# Patient Record
Sex: Male | Born: 2000 | Race: Black or African American | Hispanic: No | Marital: Single
Health system: Southern US, Community
[De-identification: ages and names within clinical notes are randomized; demographics above are authoritative.]

## PROBLEM LIST (undated history)

## (undated) DIAGNOSIS — I469 Cardiac arrest, cause unspecified: Secondary | ICD-10-CM

## (undated) DIAGNOSIS — R011 Cardiac murmur, unspecified: Secondary | ICD-10-CM

## (undated) DIAGNOSIS — J45909 Unspecified asthma, uncomplicated: Secondary | ICD-10-CM

## (undated) HISTORY — PX: IMPLANTABLE CARDIOVERTER DEFIBRILLATOR IMPLANT: SHX5860

---
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
## 2021-02-28 ENCOUNTER — Other Ambulatory Visit (HOSPITAL_COMMUNITY): Payer: Self-pay | Admitting: Pediatrics

## 2021-02-28 DIAGNOSIS — I4901 Ventricular fibrillation: Secondary | ICD-10-CM

## 2021-03-01 ENCOUNTER — Ambulatory Visit (HOSPITAL_COMMUNITY)
Admission: RE | Admit: 2021-03-01 | Discharge: 2021-03-01 | Disposition: A | Discharge status: Home / Self Care | Payer: Medicaid Other | Source: Ambulatory Visit | Attending: Pediatrics | Admitting: Pediatrics

## 2021-03-01 ENCOUNTER — Other Ambulatory Visit: Payer: Self-pay

## 2021-03-01 DIAGNOSIS — I4901 Ventricular fibrillation: Secondary | ICD-10-CM | POA: Insufficient documentation

## 2021-04-06 ENCOUNTER — Other Ambulatory Visit: Payer: Self-pay

## 2021-04-06 ENCOUNTER — Encounter (HOSPITAL_COMMUNITY): Payer: Self-pay | Admitting: *Deleted

## 2021-04-06 ENCOUNTER — Emergency Department (HOSPITAL_COMMUNITY)
Admission: EM | Admit: 2021-04-06 | Discharge: 2021-04-06 | Disposition: A | Discharge status: Home / Self Care | Payer: Medicaid Other | Attending: Emergency Medicine | Admitting: Emergency Medicine

## 2021-04-06 DIAGNOSIS — Z950 Presence of cardiac pacemaker: Secondary | ICD-10-CM | POA: Insufficient documentation

## 2021-04-06 DIAGNOSIS — Z8679 Personal history of other diseases of the circulatory system: Secondary | ICD-10-CM | POA: Insufficient documentation

## 2021-04-06 DIAGNOSIS — Z7722 Contact with and (suspected) exposure to environmental tobacco smoke (acute) (chronic): Secondary | ICD-10-CM | POA: Insufficient documentation

## 2021-04-06 DIAGNOSIS — Z79899 Other long term (current) drug therapy: Secondary | ICD-10-CM | POA: Insufficient documentation

## 2021-04-06 DIAGNOSIS — R630 Anorexia: Secondary | ICD-10-CM | POA: Diagnosis not present

## 2021-04-06 DIAGNOSIS — R55 Syncope and collapse: Secondary | ICD-10-CM | POA: Insufficient documentation

## 2021-04-06 DIAGNOSIS — J45909 Unspecified asthma, uncomplicated: Secondary | ICD-10-CM | POA: Diagnosis not present

## 2021-04-06 HISTORY — DX: Cardiac arrest, cause unspecified: I46.9

## 2021-04-06 LAB — BASIC METABOLIC PANEL
Anion gap: 9 (ref 5–15)
BUN: 10 mg/dL (ref 6–20)
CO2: 27 mmol/L (ref 22–32)
Calcium: 9.6 mg/dL (ref 8.9–10.3)
Chloride: 102 mmol/L (ref 98–111)
Creatinine, Ser: 0.93 mg/dL (ref 0.61–1.24)
GFR, Estimated: >60 mL/min (ref >60)
Glucose, Bld: 125 mg/dL — ABNORMAL HIGH (ref 70–99)
Potassium: 4.6 mmol/L (ref 3.5–5.1)
Sodium: 138 mmol/L (ref 135–145)

## 2021-04-06 LAB — CBC WITH DIFFERENTIAL/PLATELET
Abs Immature Granulocytes: 0.05 10*3/uL (ref 0.00–0.07)
Basophils Absolute: 0.1 10*3/uL (ref 0.0–0.1)
Basophils Relative: 1 %
Eosinophils Absolute: 0.2 10*3/uL (ref 0.0–0.5)
Eosinophils Relative: 3 %
HCT: 46 % (ref 39.0–52.0)
Hemoglobin: 14.5 g/dL (ref 13.0–17.0)
Immature Granulocytes: 1 %
Lymphocytes Relative: 23 %
Lymphs Abs: 1.8 10*3/uL (ref 0.7–4.0)
MCH: 25.7 pg — ABNORMAL LOW (ref 26.0–34.0)
MCHC: 31.5 g/dL (ref 30.0–36.0)
MCV: 81.4 fL (ref 80.0–100.0)
Monocytes Absolute: 0.7 10*3/uL (ref 0.1–1.0)
Monocytes Relative: 9 %
Neutro Abs: 5.2 10*3/uL (ref 1.7–7.7)
Neutrophils Relative %: 63 %
Platelets: 202 10*3/uL (ref 150–400)
RBC: 5.65 MIL/uL (ref 4.22–5.81)
RDW: 13.1 % (ref 11.5–15.5)
WBC: 8 10*3/uL (ref 4.0–10.5)
nRBC: 0 % (ref 0.0–0.2)

## 2021-04-06 LAB — TROPONIN I (HIGH SENSITIVITY)
Troponin I (High Sensitivity): 4 ng/L (ref <18)
Troponin I (High Sensitivity): 5 ng/L (ref <18)

## 2021-04-06 LAB — MAGNESIUM: Magnesium: 2.1 mg/dL (ref 1.7–2.4)

## 2021-04-06 MED ORDER — SODIUM CHLORIDE 0.9 % IV BOLUS
1000.0000 mL | Freq: Once | INTRAVENOUS | Status: AC
  Administered 2021-04-06: 1000 mL via INTRAVENOUS

## 2021-04-06 NOTE — ED Provider Notes (Signed)
[facility] EMERGENCY DEPARTMENT Provider Note   CSN: [id number] Arrival date & time: 04/06/21  4401     History Chief Complaint  Patient presents with  . Loss of Consciousness    Brian Hatfield is a 20 y.o. male.  Pt presents to the ED today with a syncopal event.  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  The pt is followed by Dr. Mindi Junker (peds cards at [facility]).  Pt has a Paediatric nurse pacemaker in place.  Pt had oral surgery on 5/17.  He had a near-syncopal event on 5/18.  He seemed to be fine and then had another syncopal event today around noon.  The pt has been eating and drinking well, but did not eat or drink prior to syncopal event. Pt was in a hot shower when he had this event.  His brother witnessed the episode.  It lasted about 2 minutes.  No shaking, but pt seemed to be staring at brother for a little bit.  He was normal after he "woke up."  He denies any prodromal feelings prior to event.  He feels fine now.        Past Medical History:  Diagnosis Date  . Asthma   . Cardiac arrest (HCC)   . Murmur     Patient Active Problem List   Diagnosis Date Noted  . Cardiac arrest (HCC) 08/23/2017  . Acute respiratory failure (HCC) 08/23/2017  . Inattention 08/26/2016    Past Surgical History:  Procedure Laterality Date  . IMPLANTABLE CARDIOVERTER DEFIBRILLATOR IMPLANT         History reviewed. No pertinent family history.  Social History   Tobacco Use  . Smoking status: Passive Smoke Exposure - Never Smoker  . Smokeless tobacco: Never Used  Substance Use Topics  . Alcohol use: No  . Drug use: No    Home Medications Prior to Admission medications   Medication Sig Start Date End Date Taking? Authorizing Provider  acetaminophen (TYLENOL) 500 MG tablet Take 1 tablet (500 mg total) by mouth every 6 (six) hours as needed for fever or headache. 09/26/14  Yes Tomasita Crumble, MD  albuterol (PROVENTIL HFA;VENTOLIN HFA) 108 (90 Base)  MCG/ACT inhaler Inhale 1-2 puffs into the lungs every 6 (six) hours as needed for wheezing or shortness of breath.   Yes [provider]  cloNIDine (CATAPRES) 0.1 MG tablet Take 0.1 mg by mouth every evening.   Yes [provider]  ibuprofen (ADVIL,MOTRIN) 400 MG tablet Take 1 tablet (400 mg total) by mouth every 6 (six) hours as needed for fever or headache. 09/26/14  Yes Tomasita Crumble, MD  nadolol (CORGARD) 40 MG tablet Take 40 mg by mouth daily.   Yes [provider]    Allergies    Other  Review of Systems   Review of Systems  Neurological: Positive for syncope.  All other systems reviewed and are negative.   Physical Exam Updated Vital Signs BP 137/60   Pulse (!) 103   Temp 99.3 F (37.4 C) (Oral)   Resp (!) 22   Ht 6\' 1"  (1.854 m)   Wt 59 kg   SpO2 97%   BMI 17.15 kg/m   Physical Exam Vitals and nursing note reviewed.  Constitutional:      Appearance: Normal appearance.  HENT:     Head: Normocephalic and atraumatic.     Right Ear: External ear normal.     Left Ear: External  ear normal.     Nose: Nose normal.     Mouth/Throat:     Mouth: Mucous membranes are moist.     Pharynx: Oropharynx is clear.  Eyes:     Extraocular Movements: Extraocular movements intact.     Conjunctiva/sclera: Conjunctivae normal.     Pupils: Pupils are equal, round, and reactive to light.  Cardiovascular:     Rate and Rhythm: Normal rate and regular rhythm.     Pulses: Normal pulses.     Heart sounds: Normal heart sounds.  Pulmonary:     Effort: Pulmonary effort is normal.     Breath sounds: Normal breath sounds.  Abdominal:     General: Abdomen is flat. Bowel sounds are normal.     Palpations: Abdomen is soft.  Musculoskeletal:        General: Normal range of motion.     Cervical back: Normal range of motion and neck supple.  Skin:    General: Skin is warm.     Capillary Refill: Capillary refill takes less than 2 seconds.  Neurological:     Mental  Status: He is alert and oriented to person, place, and time. Mental status is at baseline.  Psychiatric:        Mood and Affect: Mood normal.        Behavior: Behavior normal.     ED Results / Procedures / Treatments   Labs (all labs ordered are listed, but only abnormal results are displayed) Labs Reviewed  CBC WITH DIFFERENTIAL/PLATELET - Abnormal; Notable for the following components:      Result Value   MCH 25.7 (*)    All other components within normal limits  BASIC METABOLIC PANEL - Abnormal; Notable for the following components:   Glucose, Bld 125 (*)    All other components within normal limits  MAGNESIUM  TROPONIN I (HIGH SENSITIVITY)  TROPONIN I (HIGH SENSITIVITY)    EKG EKG Interpretation  Date/Time:  Thursday Apr 06 2021 16:09:26 EDT Ventricular Rate:  113 PR Interval:  182 QRS Duration: 92 QT Interval:  324 QTC Calculation: 444 R Axis:   270 Text Interpretation: Sinus tachycardia Right atrial enlargement Left axis deviation Pulmonary disease pattern Abnormal ECG No old tracing to compare Confirmed by Jacalyn Lefevre [phone]) on 04/06/2021 6:05:35 PM   Radiology No results found.  Procedures Procedures   Medications Ordered in ED Medications  sodium chloride 0.9 % bolus 1,000 mL (1,000 mLs Intravenous New Bag/Given 04/06/21 1842)    ED Course  I have reviewed the triage vital signs and the nursing notes.  Pertinent labs & imaging results that were available during my care of the patient were reviewed by me and considered in my medical decision making (see chart for details).    MDM Rules/Calculators/A&P                          Pt is on Nadolol 40 mg daily.  He missed his dose this am.  He has not missed any prior doses.  Pt's pacemaker interrogated.  Per report, he had a VT episode at 2:17 pm with 172 bpm.  The AICD is not triggered to go off until 214 bpm.  It lasted for 2 minutes and 36 seconds.  This does not coincide with his syncopal  event.  I spoke with the pediatric cardiologist on call at [facility].  He asked that I take pictures of the [facility] printout and put them in  the media section so the EP specialists at [facility] can review.  I did that and the EP people think it was ST.  Not VT.  No med changes recommended.  Pt is to make sure he takes his meds as directed.    Syncopal event today likely due to hot shower and not eating.  Labs nl.  Dr. Mindi Junker can follow up with pt on Wed, June 1 at the [facility] office.  Pt and mom feel comfortable with this plan.  He is to return if worse.  CRITICAL CARE Performed by: Jacalyn Lefevre   Total critical care time: 30 minutes  Critical care time was exclusive of separately billable procedures and treating other patients.  Critical care was necessary to treat or prevent imminent or life-threatening deterioration.  Critical care was time spent personally by me on the following activities: development of treatment plan with patient and/or surrogate as well as nursing, discussions with consultants, evaluation of patient's response to treatment, examination of patient, obtaining history from patient or surrogate, ordering and performing treatments and interventions, ordering and review of laboratory studies, ordering and review of radiographic studies, pulse oximetry and re-evaluation of patient's condition. Final Clinical Impression(s) / ED Diagnoses Final diagnoses:  Syncope, unspecified syncope type    Rx / DC Orders ED Discharge Orders    None       Jacalyn Lefevre, MD 04/06/21 2103

## 2021-04-06 NOTE — ED Notes (Signed)
Pt verbalizes understanding of d/c instructions. Pt ambulatory at d/c with all belongings and with family.   

## 2021-04-06 NOTE — ED Notes (Signed)
Pt's Medtronic pacemaker interrogated. 

## 2021-04-06 NOTE — ED Triage Notes (Signed)
Pt reports having syncopal episode today, similar episode last wed. Had wisdom teeth removed last Tuesday. Pt has no complaints, states he felt fine today. Hx of vfib arrest when 20yo.

## 2021-04-06 NOTE — ED Provider Notes (Signed)
Emergency Medicine Provider Triage Evaluation Note  Brian Hatfield , a 20 y.o. male  was evaluated in triage.  Pt complains of syncope. He had some preceding lightheadedness and vision changes prior to the episode. No preceding chest pain or sob. Defibrillator did not fire.   Review of Systems  Positive: syncope Negative: Chest pain, sob  Physical Exam  BP (!) 119/104   Pulse 100   Temp 99.3 F (37.4 C) (Oral)   Resp 16   SpO2 100%  Gen:   Awake, no distress   Resp:  Normal effort  MSK:   Moves extremities without difficulty  Other:  Clear speech, tachycardic  Medical Decision Making  Medically screening exam initiated at 4:32 PM.  Appropriate orders placed.  Khan A Sheppard was informed that the remainder of the evaluation will be completed by another provider, this initial triage assessment does not replace that evaluation, and the importance of remaining in the ED until their evaluation is complete.     Karrie Meres, PA-C 04/06/21 1638    Margarita Grizzle, MD 04/06/21 2308

## 2024-01-21 DIAGNOSIS — Z931 Gastrostomy status: Secondary | ICD-10-CM | POA: Insufficient documentation

## 2024-01-21 DIAGNOSIS — K30 Functional dyspepsia: Secondary | ICD-10-CM | POA: Insufficient documentation

## 2024-01-21 DIAGNOSIS — G931 Anoxic brain damage, not elsewhere classified: Secondary | ICD-10-CM | POA: Insufficient documentation

## 2024-01-21 DIAGNOSIS — I429 Cardiomyopathy, unspecified: Secondary | ICD-10-CM | POA: Insufficient documentation

## 2024-01-21 DIAGNOSIS — T827XXA Infection and inflammatory reaction due to other cardiac and vascular devices, implants and grafts, initial encounter: Secondary | ICD-10-CM | POA: Insufficient documentation

## 2024-01-21 DIAGNOSIS — R112 Nausea with vomiting, unspecified: Secondary | ICD-10-CM | POA: Insufficient documentation

## 2024-01-27 DIAGNOSIS — R7303 Prediabetes: Secondary | ICD-10-CM | POA: Insufficient documentation

## 2024-02-03 DIAGNOSIS — F331 Major depressive disorder, recurrent, moderate: Secondary | ICD-10-CM | POA: Insufficient documentation

## 2024-06-30 ENCOUNTER — Encounter: Payer: Self-pay | Admitting: Neurology

## 2024-06-30 ENCOUNTER — Ambulatory Visit: Payer: Self-pay | Admitting: Neurology

## 2024-06-30 NOTE — Progress Notes (Signed)
 Chief Complaint  Patient presents with   Establish Care   Gait Problem    Pt in room 14, with mom, hx of TBI, Would like to have an MRI    ASSESSMENT AND PLAN  Brian Hatfield is a 23 y.o. male   History of hypoxic encephalopathy  Initial MRI showed significant abnormality,  He has residual cognitive malfunction mild motor difficulties, family desire further evaluation,  MRI of the brain with without contrast  EEG  Neuropsychology evaluation  Return in 6 months  DIAGNOSTIC DATA (LABS, IMAGING, TESTING) - I reviewed patient records, labs, notes, testing and imaging myself where available.   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 I personally reviewed pertinent available imaging films in PACS.   PMHx of  Hx of  defibrillator implant   I reviewed the pediatric neurology evaluation at [facility] system on January 02, 2018,   Patient suffered cardiac arrest on August 23, 2017 at age 22, after playing basketball, he suddenly became unresponsive, pulseless upon EMS arrival, required prolonged CPR, AED showed ventricular fibrillation, defibrillated twice with subsequent sinus tachycardia, initially was treated at [facility] emergency room, pulseless ventricular tachycardia during transportation, defibrillated twice again, there was also ventricular bigeminy with epinephrine  infusion, initial gases showed pH 7.13, lactate 10, troponin 0.34, bicarb 16, chest x-ray showed possible cardiomegaly, bilateral apical pneumothoraces,  He was later transferred to [facility], started on esmolol  infusion for 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, QT interval 480- 490  initially, improved to 420-430  He had intubation, required hypothermia protocol, abnormal EEG, nonconvulsive status epilepticus response to Phospha phenytoin, Keppra, lorazepam  MRI of the brain without contrast at [facility] system August 28, 2017 showed diffuse supratentorial cortical diffuse restriction, compatible with hypoxic ischemic encephalopathy, multiple foci of diffuse restriction in the left cerebellar hemisphere concerning for punctuate foci of acute infarction, no hemorrhagic transformation  Prolonged video EEG monitoring on Aug 29 2017: Moderate to severe diffuse slowing, left more than right occipital sharps, improved over the course of the recording  He subsequently developed neurostorming 1 week postarrest, which required increased amount of propofol, bromocriptine, gabapentin, baclofen, clonazepam, Artane, tizanidine, sedating medications,  Initial course was also complicated by spasticity, dystonia, persistent choreiform movement, then he eventually began to improve, without any further seizure activity, began follow commands, speaking by October 04, 2017, was discharged to rehabilitation with polypharmacy, clonidine 100 mcg every 6 hours, gabapentin 600/300/600, bromocriptine 3.75 mg twice a day, clonazepam 0.25 mg every night, oxycodone 5 mg every 8 hours, baclofen 15 mg 3 times a day, Artane 2 mg twice a day  He later underwent prolonged neurorehabilitation, by the return visit in February 2019, he was able to ambulate without difficulty  Labs in March 2025, negative HIV, RPR,  creat 0.98 A1C 6, TSH, Vit D 23,  He now lives at home with his parents, spend most of the time playing video games, watching TV, no clinical seizure activity, would like to walk in the park at 5 minutes away from his home, still has mildly unsteady gait, significant short-term memory loss, difficulty reading, at third grade level, had neuropsychology evaluation prior to be granted social disability few  years back    PHYSICAL EXAM:  Vitals:   06/30/24 0823  BP: (!) 149/73  Pulse: 63  Weight: 159 lb 8 oz (72.3 kg)      Body mass index is 21.04 kg/m.  PHYSICAL EXAMNIATION:  Gen: NAD, conversant, well nourised, well groomed                     Cardiovascular: Regular rate rhythm, no peripheral edema, warm, nontender. Eyes: Conjunctivae clear without exudates or hemorrhage Neck: Supple, no carotid bruits. Pulmonary: Clear to auscultation bilaterally   NEUROLOGICAL EXAM:  MENTAL STATUS: Speech/cognition: Quiet, rely on his mother to provide history, able to answer questions with some hesitation, cooperative on neurological examination  CRANIAL NERVES: CN II: Visual fields are full to confrontation. Pupils are round equal and briskly reactive to light. CN III, IV, VI: extraocular movement are normal. No ptosis. CN V: Facial sensation is intact to light touch CN VII: Face is symmetric with normal eye closure  CN VIII: Hearing is normal to causal conversation. CN IX, X: Phonation is normal. CN XI: Head turning and shoulder shrug are intact  MOTOR: Mild right hand tremor, normal strength, mild lower extremity spasticity,  REFLEXES: Reflexes are 2+ and symmetric at the biceps, triceps, knees, and ankles. Plantar responses are flexor.  SENSORY: Intact to light touch, pinprick and vibratory sensation are intact in fingers and toes.  COORDINATION: There is no trunk or limb dysmetria noted.  GAIT/STANCE: Toes pointed outwards, cautious  REVIEW OF SYSTEMS:  Full 14 system review of systems performed and notable only for as above All other review of systems were negative.   ALLERGIES: Allergies  Allergen Reactions   Other     Seasonal allergies     HOME MEDICATIONS: Current Outpatient Medications  Medication Sig Dispense Refill   nadolol (CORGARD) 40 MG tablet Take 40 mg by mouth daily.     acetaminophen  (TYLENOL ) 500 MG tablet Take 1 tablet (500 mg total) by  mouth every 6 (six) hours as needed for fever or headache. (Patient not taking: Reported on 06/30/2024) 20 tablet 0   albuterol (PROVENTIL HFA;VENTOLIN HFA) 108 (90 Base) MCG/ACT inhaler Inhale 1-2 puffs into the lungs every 6 (six) hours as needed for wheezing or shortness of breath. (Patient not taking: Reported on 06/30/2024)     cloNIDine (CATAPRES) 0.1 MG tablet Take 0.1 mg by mouth every evening. (Patient not taking: Reported on 06/30/2024)     ibuprofen  (ADVIL ,MOTRIN ) 400 MG tablet Take 1 tablet (400 mg total) by mouth every 6 (six) hours as needed for fever or headache. (Patient not taking: Reported on 06/30/2024) 20 tablet 0   No current facility-administered medications for this visit.    PAST MEDICAL HISTORY: Past Medical History:  Diagnosis Date   Asthma    Cardiac arrest (HCC)    Murmur     PAST SURGICAL HISTORY: Past Surgical History:  Procedure Laterality Date   IMPLANTABLE CARDIOVERTER DEFIBRILLATOR IMPLANT      FAMILY HISTORY: History reviewed. No pertinent family history.  SOCIAL HISTORY: Social History   Socioeconomic History   Marital status: Single    Spouse name: Not on file   Number of children: Not on file   Years of education: Not on file   Highest education level: Not on file  Occupational History   Not on file  Tobacco Use   Smoking status: Passive Smoke Exposure - Never Smoker   Smokeless tobacco: Never  Substance and Sexual Activity   Alcohol use: No   Drug use:  No   Sexual activity: Never  Other Topics Concern   Not on file  Social History Narrative   ** Merged History Encounter **       Social Drivers of Health   Financial Resource Strain: Not on File (03/01/2022)   Received from [employer]    Financial Resource Strain: 0  Food Insecurity: Not at Risk (01/21/2024)   Received from [facility] Insecurity    Within the past 12 months, you worried that your food would run out before you got money to buy more.: 1   Transportation Needs: Not at Risk (01/21/2024)   Received from [facility] Needs    In the past 12 months, has lack of transportation kept you from medical appointments, meetings, work or from getting things needed for daily living?: 1  Physical Activity: Insufficiently Active (06/30/2024)   Exercise Vital Sign    Days of Exercise per Week: 4 days    Minutes of Exercise per Session: 30 min  Stress: Stress Concern Present (06/30/2024)   [employer] of Occupational Health - Occupational Stress Questionnaire    Feeling of Stress: Rather much  Social Connections: Unknown (06/30/2024)   Social Connection and Isolation Panel    Frequency of Communication with Friends and Family: Once a week    Frequency of Social Gatherings with Friends and Family: Not on file    Attends Religious Services: Never    Database administrator or Organizations: No    Attends Banker Meetings: Never    Marital Status: Never married  Intimate Partner Violence: Not At Risk (06/30/2024)   Humiliation, Afraid, Rape, and Kick questionnaire    Fear of Current or Ex-Partner: No    Emotionally Abused: No    Physically Abused: No    Sexually Abused: No      Modena Callander, M.D. Ph.D.  [facility] [address], [address] [place], [place] [address] Ph: [phone] Fax: [phone]  CC:  Duwaine Annabella SAILOR, FNP [address] [place],  [place] [address]  Duwaine Annabella SAILOR, FNP

## 2024-07-01 ENCOUNTER — Telehealth: Payer: Self-pay | Admitting: Neurology

## 2024-07-01 NOTE — Telephone Encounter (Signed)
 Referral to Neuropsychology faxed to [facility]   [facility]  Phone: [phone] Fax: [phone]

## 2024-07-01 NOTE — Telephone Encounter (Signed)
 No auth required sent to [facility] [phone]

## 2024-07-06 NOTE — Telephone Encounter (Signed)
 Patient's mother called in states pt has a defibrillator and GI stated he needs to go to hospital for MRI. I let her know we would reroute that for him

## 2024-07-07 NOTE — Telephone Encounter (Signed)
 I changed the order to [facility] and they will call him to schedule. [phone]

## 2024-07-08 ENCOUNTER — Ambulatory Visit: Admitting: Neurology

## 2024-07-08 DIAGNOSIS — R2990 Unspecified symptoms and signs involving the nervous system: Secondary | ICD-10-CM | POA: Diagnosis not present

## 2024-07-12 NOTE — Procedures (Signed)
   HISTORY: 23 year old male with history of hypoxic encephalopathy  TECHNIQUE:  This is a routine 16 channel EEG recording with one channel devoted to a limited EKG recording.  It was performed during wakefulness, drowsiness and asleep.  Hyperventilation and photic stimulation were performed as activating procedures.     Upon maximum arousal, posterior dominant waking rhythm consistent of very low amplitude dysrhythmic activities, close to the end of the tracing, it was noted to have alpha rhythm, activities are symmetric over the bilateral posterior derivations and attenuated with eye opening.  There was frequent bifrontal muscle artifact.  Photic stimulation did not alter the tracing.  Hyperventilation produced mild/moderate buildup with higher amplitude and the slower activities noted.  During EEG recording, patient developed drowsiness and no deeper stage of sleep was achieved,  During EEG recording, there was no epileptiform discharge noted.  EKG demonstrate normal sinus rhythm.  CONCLUSION: This is a technically difficult study due to frequent bifrontal muscle artifact, significantly low posterior background activity.  At the end of the tracing, there was short rhythmic alpha activity.  There is no electrodiagnostic evidence of epileptiform discharge.  Severn Goddard, M.D. Ph.D.  [facility] [address] [place], [place] [address] Phone: [phone] Fax:      [phone]

## 2024-07-24 NOTE — CV Procedure (Signed)
  Device system confirmed to be MRI conditional, with implant date > 6 weeks ago, and no evidence of abandoned or epicardial leads in review of most recent CXR  Device last cleared by EP Provider: Charlies Arthur 07/24/24  Clearance is good through for 1 year as long as parameters remain stable at time of check. If pt undergoes a cardiac device procedure during that time, they should be re-cleared.   Tachy-therapies to be programmed off if applicable with device back to pre-MRI settings after completion of exam.  Medtronic - Programming recommendation received through Medtronic App/Tablet  Rocky Catalan, RT  07/24/2024 4:19 PM

## 2024-07-28 ENCOUNTER — Ambulatory Visit (HOSPITAL_COMMUNITY)
Admission: RE | Admit: 2024-07-28 | Discharge: 2024-07-28 | Disposition: A | Discharge status: Home / Self Care | Payer: Self-pay | Source: Ambulatory Visit | Attending: Neurology | Admitting: Neurology

## 2024-07-28 MED ORDER — GADOBUTROL 1 MMOL/ML IV SOLN
7.0000 mL | Freq: Once | INTRAVENOUS | Status: AC | PRN
  Administered 2024-07-28: 7 mL via INTRAVENOUS

## 2024-07-28 NOTE — Progress Notes (Signed)
 Patient was monitored by this RN during MRI scan due to presence of a ICD. Cardiac rhythm was continuously monitored throughout the procedure. Prior to the start of the scan, the ICD was placed in MRI-safe mode by the MRI technician and/or device representative. Following the completion of the scan, the device was returned to its pre-MRI settings. Neurological status and orientation post-procedure were unchanged from baseline.   Pre-procedure Heart Rate (Prior to being placed in MRI safe mode):90's bpm Post-procedure Heart Rate (Once pacemaker is returned to baseline mode): 90's bpm

## 2024-08-03 ENCOUNTER — Ambulatory Visit: Payer: Self-pay | Admitting: Neurology

## 2025-01-19 ENCOUNTER — Ambulatory Visit: Admitting: Family Medicine
